# Patient Record
Sex: Male | Born: 1970 | Hispanic: No | Marital: Married | Smoking: Current every day smoker
Health system: Southern US, Community
[De-identification: ages and names within clinical notes are randomized; demographics above are authoritative.]

---

## 1999-01-08 ENCOUNTER — Encounter: Payer: Self-pay | Admitting: Specialist

## 1999-01-08 ENCOUNTER — Ambulatory Visit (HOSPITAL_COMMUNITY): Admission: RE | Admit: 1999-01-08 | Discharge: 1999-01-08 | Payer: Self-pay | Admitting: Specialist

## 1999-01-12 ENCOUNTER — Ambulatory Visit (HOSPITAL_COMMUNITY): Admission: RE | Admit: 1999-01-12 | Discharge: 1999-01-12 | Payer: Self-pay | Admitting: Specialist

## 2012-04-05 ENCOUNTER — Other Ambulatory Visit: Payer: Self-pay | Admitting: Family Medicine

## 2012-04-05 DIAGNOSIS — M25511 Pain in right shoulder: Secondary | ICD-10-CM

## 2012-04-10 ENCOUNTER — Ambulatory Visit
Admission: RE | Admit: 2012-04-10 | Discharge: 2012-04-10 | Disposition: A | Discharge status: Home / Self Care | Payer: Worker's Compensation | Source: Ambulatory Visit | Attending: Family Medicine | Admitting: Family Medicine

## 2012-04-10 DIAGNOSIS — M25511 Pain in right shoulder: Secondary | ICD-10-CM

## 2017-12-29 ENCOUNTER — Emergency Department (HOSPITAL_COMMUNITY)
Admission: EM | Admit: 2017-12-29 | Discharge: 2017-12-29 | Disposition: A | Discharge status: Home / Self Care | Payer: Worker's Compensation | Attending: Emergency Medicine | Admitting: Emergency Medicine

## 2017-12-29 ENCOUNTER — Emergency Department (HOSPITAL_COMMUNITY): Payer: Worker's Compensation

## 2017-12-29 ENCOUNTER — Other Ambulatory Visit: Payer: Self-pay

## 2017-12-29 ENCOUNTER — Encounter (HOSPITAL_COMMUNITY): Payer: Self-pay | Admitting: *Deleted

## 2017-12-29 DIAGNOSIS — Y929 Unspecified place or not applicable: Secondary | ICD-10-CM | POA: Insufficient documentation

## 2017-12-29 DIAGNOSIS — Y999 Unspecified external cause status: Secondary | ICD-10-CM | POA: Insufficient documentation

## 2017-12-29 DIAGNOSIS — M542 Cervicalgia: Secondary | ICD-10-CM | POA: Diagnosis present

## 2017-12-29 DIAGNOSIS — M545 Low back pain, unspecified: Secondary | ICD-10-CM

## 2017-12-29 DIAGNOSIS — Z79899 Other long term (current) drug therapy: Secondary | ICD-10-CM | POA: Insufficient documentation

## 2017-12-29 DIAGNOSIS — M25512 Pain in left shoulder: Secondary | ICD-10-CM

## 2017-12-29 DIAGNOSIS — Y939 Activity, unspecified: Secondary | ICD-10-CM | POA: Diagnosis not present

## 2017-12-29 DIAGNOSIS — F17200 Nicotine dependence, unspecified, uncomplicated: Secondary | ICD-10-CM | POA: Diagnosis not present

## 2017-12-29 MED ORDER — NAPROXEN 250 MG PO TABS
375.0000 mg | ORAL_TABLET | Freq: Once | ORAL | Status: AC
  Administered 2017-12-29: 375 mg via ORAL
  Filled 2017-12-29: qty 2

## 2017-12-29 MED ORDER — NAPROXEN 375 MG PO TABS: 375.0000 mg | ORAL_TABLET | Freq: Two times a day (BID) | ORAL | 0 refills | Status: AC

## 2017-12-29 MED ORDER — CYCLOBENZAPRINE HCL 10 MG PO TABS: 10.0000 mg | ORAL_TABLET | Freq: Three times a day (TID) | ORAL | 0 refills | Status: AC | PRN
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
## 2017-12-29 NOTE — Discharge Instructions (Addendum)
Your imaging was normal.  This likely musculoskeletal pain. Please take the Naproxen as prescribed for pain. Do not take any additional NSAIDs including Motrin, Aleve, Ibuprofen, Advil. Please the the flexeril for muscle relaxation. This medication will make you drowsy so avoid situation that could place you in danger.   Concussion hotline: [phone]).  Warm compresses and warm soaks.  Perform stretches as discussed.  Follow-up with your primary care doctor next week if your symptoms not improving and return to ED if you develop any worsening symptoms including difficulties walking, off balance, difficulty speaking, facial droop, worsening pain, worsening headache or for any other reason.   You have been diagnosed with a concussion.  Ibuprofen or Tylenol for pain Rest, ice on head.  Stay in a quiet, non-simulating, dark environment. No TV, computer use, video games until headache is resolved completely.  Follow up with your primary care physician if headache persists.  Return to the emergency department if patient becomes lethargic, begins vomiting or other change in mental status.  HEAD INJURY If any of the following occur notify your physician or go to the Hospital Emergency Department:  Increased drowsiness, stupor or loss of consciousness  Temperature above 100 F  Vomiting  Severe headache  Blood or clear fluid dripping from the nose or ears  Stiffness of the neck  Dizziness or blurred vision  Any other unusual symptoms  PRECAUTIONS  Keep head elevated at all times for the first 24 hours (Elevate mattress if pillow is ineffective)  Do not take sedatives, narcotics or alcohol  Avoid aspirin. Use only acetaminophen (e.g. Tylenol) or ibuprofen (e.g. Advil) for relief of pain. Follow directions on the bottle for dosage.  Use ice packs
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
